# Patient Record
Sex: Female | Born: 1975 | Hispanic: Yes | Marital: Single | Smoking: Never smoker
Health system: Southern US, Community
[De-identification: ages and names within clinical notes are randomized; demographics above are authoritative.]

## PROBLEM LIST (undated history)

## (undated) DIAGNOSIS — F329 Major depressive disorder, single episode, unspecified: Secondary | ICD-10-CM

## (undated) DIAGNOSIS — F32A Depression, unspecified: Secondary | ICD-10-CM

## (undated) HISTORY — DX: Depression, unspecified: F32.A

## (undated) HISTORY — DX: Major depressive disorder, single episode, unspecified: F32.9

---

## 2015-01-25 ENCOUNTER — Ambulatory Visit (INDEPENDENT_AMBULATORY_CARE_PROVIDER_SITE_OTHER): Payer: Self-pay

## 2015-01-25 ENCOUNTER — Ambulatory Visit (INDEPENDENT_AMBULATORY_CARE_PROVIDER_SITE_OTHER): Payer: Self-pay | Admitting: Urgent Care

## 2015-01-25 VITALS — BP 108/82 | HR 84 | Temp 97.8°F | Resp 16 | Ht <= 58 in | Wt 124.8 lb

## 2015-01-25 DIAGNOSIS — M25519 Pain in unspecified shoulder: Secondary | ICD-10-CM | POA: Insufficient documentation

## 2015-01-25 DIAGNOSIS — S46912A Strain of unspecified muscle, fascia and tendon at shoulder and upper arm level, left arm, initial encounter: Secondary | ICD-10-CM

## 2015-01-25 DIAGNOSIS — R200 Anesthesia of skin: Secondary | ICD-10-CM

## 2015-01-25 DIAGNOSIS — R202 Paresthesia of skin: Secondary | ICD-10-CM

## 2015-01-25 DIAGNOSIS — M542 Cervicalgia: Secondary | ICD-10-CM

## 2015-01-25 DIAGNOSIS — M25512 Pain in left shoulder: Secondary | ICD-10-CM

## 2015-01-25 DIAGNOSIS — M62830 Muscle spasm of back: Secondary | ICD-10-CM | POA: Insufficient documentation

## 2015-01-25 MED ORDER — MELOXICAM 7.5 MG PO TABS: 7.5000 mg | ORAL_TABLET | Freq: Every day | ORAL | Status: AC

## 2015-01-25 MED ORDER — CYCLOBENZAPRINE HCL 5 MG PO TABS: 5.0000 mg | ORAL_TABLET | Freq: Three times a day (TID) | ORAL | Status: AC | PRN

## 2015-01-25 NOTE — Patient Instructions (Addendum)
Dolor en el hombro ( Shoulder Pain) El hombro es la articulacin que une los brazos al cuerpo. Los [employer] que forman la articulacin del hombro son el hueso del brazo (hmero), el omplato (escpula) y Curator. La parte superior del hmero es similar a una bola y Chartered certified accountant en una cavidad ms bien plana en la escpula (cavidad glenoidea). Una combinacin de msculos y tejidos fuertes y fibrosos que [employer] msculos a los huesos (tendones) soportan la articulacin del hombro y Engineer, materials la bola en la cavidad. En diferentes zonas de la articulacin hay pequeas bolsas llenas de lquido (bursa). Actan como amortiguadores [employer] y los tejidos blandos que recubren y [place] a reducir la friccin [employer] tendones y el hueso al mover el brazo. La articulacin del hombro permite una amplia gama de movimientos del brazo. Este rango de movimientos permite hacer diferentes cosas,como rascarse la espalda o lanzar una pelota. Sin embargo, esta amplitud de movimientos del hombro tambin lo hace ms propenso al dolor por uso excesivo y por lesiones. Las causas de dolor en el hombro pueden originarse tanto en las lesiones como en el uso excesivo y se pueden agrupar en las siguientes cuatro categoras:  Enrojecimiento, hinchazn y dolor (inflamacin) del tendn (tendinitis) o la bursa (bursitis).  Inestabilidad, como en la luxacin de la articulacin.  Inflamacin de la articulacin (artritis).  Un hueso roto Customer service manager). INSTRUCCIONES PARA EL CUIDADO EN EL HOGAR   Aplique hielo sobre la zona dolorida.  Ponga el hielo en una bolsa plstica.  Colquese una toalla entre la piel y la bolsa de hielo.  Deje el hielo durante 15 a , 3 a 4veces por [employer] [address], o segn las indicaciones del mdico.  Deje de usar compresas fras si no Tourist information centre manager.  Si tiene un cabestrillo o inmovilizador de hombro, llvelo del modo en que su mdico le indique. Solo debe quitrselo  para ducharse o baarse. Mueva el brazo lo menos posible, pero mantenga la mano en movimiento para evitar la hinchazn.  Apriete una pelota blanda o una almohadilla de goma todo lo posible para evitar la hinchazn.  Utilice los medicamentos de venta libre o recetados para Primary school teacher, Environmental health practitioner o la fiebre, segn se lo indique el mdico. SOLICITE ATENCIN MDICA SI:   El dolor en el hombro aumenta o siente un dolor nuevo en el brazo, la mano o los dedos.  La mano o los dedos estn fros y adormecidos.  El dolor no se alivia con los [employer]. SOLICITE ATENCIN MDICA DE INMEDIATO SI:   El brazo, la mano o los dedos estn adormecidos o siente hormigueos.  El brazo, la mano o los dedos estn muy hinchados o se ven blancos o azules. ASEGRESE DE QUE:   Comprende estas instrucciones.  Controlar su afeccin.  Recibir ayuda de inmediato si no mejora o si empeora. Document Released: 03/31/2005 Document Revised: 11/05/2013 [facility] Patient Information 2015 [place], [place]. This information is not intended to replace advice given to you by your health care provider. Make sure you discuss any questions you have with your health care provider.   Calambres y espasmos musculares  (Muscle Cramps and Spasms)  Los calambres musculares y espasmos ocurren cuando un msculo o grupos de msculos se tensan y no se tiene control sobre esta tensin (contraccin muscular involuntaria). Es un problema comn y Software engineer en cualquier msculo. La zona ms comn son los msculos de la pantorrilla. Tanto los [employer]  los espasmos son contracciones musculares involuntarias, pero tambin tienen diferencias:   Los calambres musculares son espordicos y dolorosos. Pueden durar entre algunos segundos hasta un cuarto de [place]. Los calambres musculares son ms fuertes y duran ms que los espasmos musculares.  Los espasmos pueden o no ser dolorosos. Pueden durar algunos segundos o mucho ms. CAUSAS   No es frecuente que los calambres se deban a un trastorno subyacente grave. En muchos casos, la causa de los calambres y los espasmos es desconocida. Algunas causas frecuentes son:   Esfuerzo excesivo.   El uso excesivo del msculo por movimientos repetitivos (hacer lo mismo una y Laverda Page).   Permanecer en cierta posicin durante un largo perodo de [place].   Preparacin, forma o tcnica inadecuada al realizar un deporte o [place].   Deshidratacin.   Traumatismos.   Efectos secundarios de algunos medicamentos.  Niveles anormalmente bajos de las sales e iones en la sangre (electrolitos), especialmente el potasio y el calcio. Pueden ocurrir cuando se toman pldoras para Geographical information systems officer (diurticos) o en las mujeres embarazadas.  Algunos problemas mdicos subyacentes pueden hacer que sea ms propenso a desarrollar calambres o espasmos. Estos incluyen, pero no se limitan a:   Diabetes.   Enfermedad de Parkinson.   Trastornos hormonales, tales como problemas de la tiroides.   El consumo excesivo de alcohol.   Enfermedades especficas de [employer], las articulaciones y [place].   Enfermedad vascular en la que no llega suficiente sangre a los msculos.  INSTRUCCIONES PARA EL CUIDADO EN EL HOGAR   Mantngase bien hidratado. Beba gran cantidad de lquido para mantener la orina de tono claro o color amarillo plido.  Puede ser til Engineer, maintenance (IT), Therapist, music y International aid/development worker el msculo afectado.  Para los msculos tensos o apretados, use una toalla caliente, una almohadilla trmica o agua caliente de la ducha dirigida a la zona afectada.  Si est dolorido o siente dolor despus de un calambre o espasmo, aplique hielo en el rea afectada para Acupuncturist.  Ponga el hielo en una bolsa plstica.  Colquese una toalla entre la piel y la bolsa de hielo.  Deje el hielo en el lugar durante 15 a 20 minutos, 3 a 4 veces por da.  Los medicamentos que se utilizan para tratar las  causas conocidas de los calambres o espasmos pueden reducir su frecuencia o gravedad. Tome slo medicamentos de venta libre o recetados, segn las indicaciones del mdico. SOLICITE ATENCIN MDICA SI:  Los calambres o espasmos empeoran, ocurren con ms frecuencia o no mejoran con Museum/gallery conservator.  ASEGRESE DE QUE:   Comprende estas instrucciones.  Controlar su enfermedad.  Solicitar ayuda de inmediato si no mejora o si empeora. Document Released: 03/31/2005 Document Revised: 10/16/2012 [facility] Patient Information 2015 [place], [place]. This information is not intended to replace advice given to you by your health care provider. Make sure you discuss any questions you have with your health care provider.

## 2015-01-25 NOTE — Progress Notes (Signed)
    MRN: [id number] DOB: [birth year]  Subjective:   Erica Pittman is a 39 y.o. female presenting for chief complaint of Shoulder Pain  Reports 1 week history of left shoulder pain. Patient works in English as a second language teacher, is a Producer, television/film/video. She states that she remembers lifting something heavy about a week ago and since then has had both back pain and left shoulder pain. Her left shoulder pain is most bothersome, is now constant, achy in nature with sharp intermittent pain, worse with movement. Her neck pain is achy and sometimes radiates into her left arm, reports that she feels some tingling. Has tried Tylenol and Advil, was helping initially but no longer having any relief with these medications, has had difficulty sleeping due to her left shoulder pain for last couple of nights. Denies fever, swelling, neck stiffness, weakness, hearing popping or tearing noise when she initially injured her shoulder. Denies any other aggravating or relieving factors, no other questions or concerns.  Erica Pittman currently has no medications in their medication list. She has No Known Allergies.  Erica Pittman  has a past medical history of Depression. Also  has no past surgical history on file.  ROS As in subjective.  Objective:   Vitals: BP 108/82 mmHg  Pulse 84  Temp(Src) 97.8 F (36.6 C) (Oral)  Resp 16  Ht  (1.422 m)  Wt 124 lb 12.8 oz (56.609 kg)  BMI 28.00 kg/m2  SpO2 99%  LMP 12/28/2014  Physical Exam  Constitutional: She is oriented to person, place, and time. She appears well-developed and well-nourished.  Cardiovascular: Normal rate.   Pulmonary/Chest: Effort normal.  Musculoskeletal:       Left shoulder: She exhibits tenderness (over deltoid and left trapezius) and spasm (over trapezius). She exhibits normal range of motion, no bony tenderness, no swelling, no effusion, no crepitus, no deformity, no laceration and normal strength.       Cervical back: She exhibits tenderness (over  paraspinal muscles) and spasm. She exhibits normal range of motion, no bony tenderness, no swelling, no edema, no deformity and no laceration.  Negative Spurling, Hawkins and Neer test. Strength 5/5.  Neurological: She is alert and oriented to person, place, and time. She has normal reflexes.  Skin: Skin is warm and dry. No rash noted. No erythema. No pallor.   [facility] reading (PRIMARY) by  Dr. Milus Glazier and PA-Ragna Kramlich. Cervical - suggestion of foraminal encroachment at level of C7-T1 on left, please comment. Left shoulder - normal.  Assessment and Plan :   1. Neck pain 2. Pain in joint, shoulder region, left 3. Numbness and tingling in left arm 4. Shoulder strain, left, initial encounter 5. Muscle spasm of back - Will opt for conservative management with meloxicam and flexeril, if patient does not have any improvement in 2 weeks, will refer to ortho for further evaluation. Consider MRI for soft tissue evaluation, joint injection.  Wallis Bamberg, PA-C [facility] [phone] 01/25/2015 10:13 AM

## 2017-02-23 IMAGING — CR DG SHOULDER 2+V*L*
3 series · 3 of 3 positions shown · non-contrast
Comparison: None.

CLINICAL DATA: Neck and left shoulder pain. No known injury.
Initial encounter.

EXAM:
LEFT SHOULDER - 2+ VIEW

[ap ext rot]
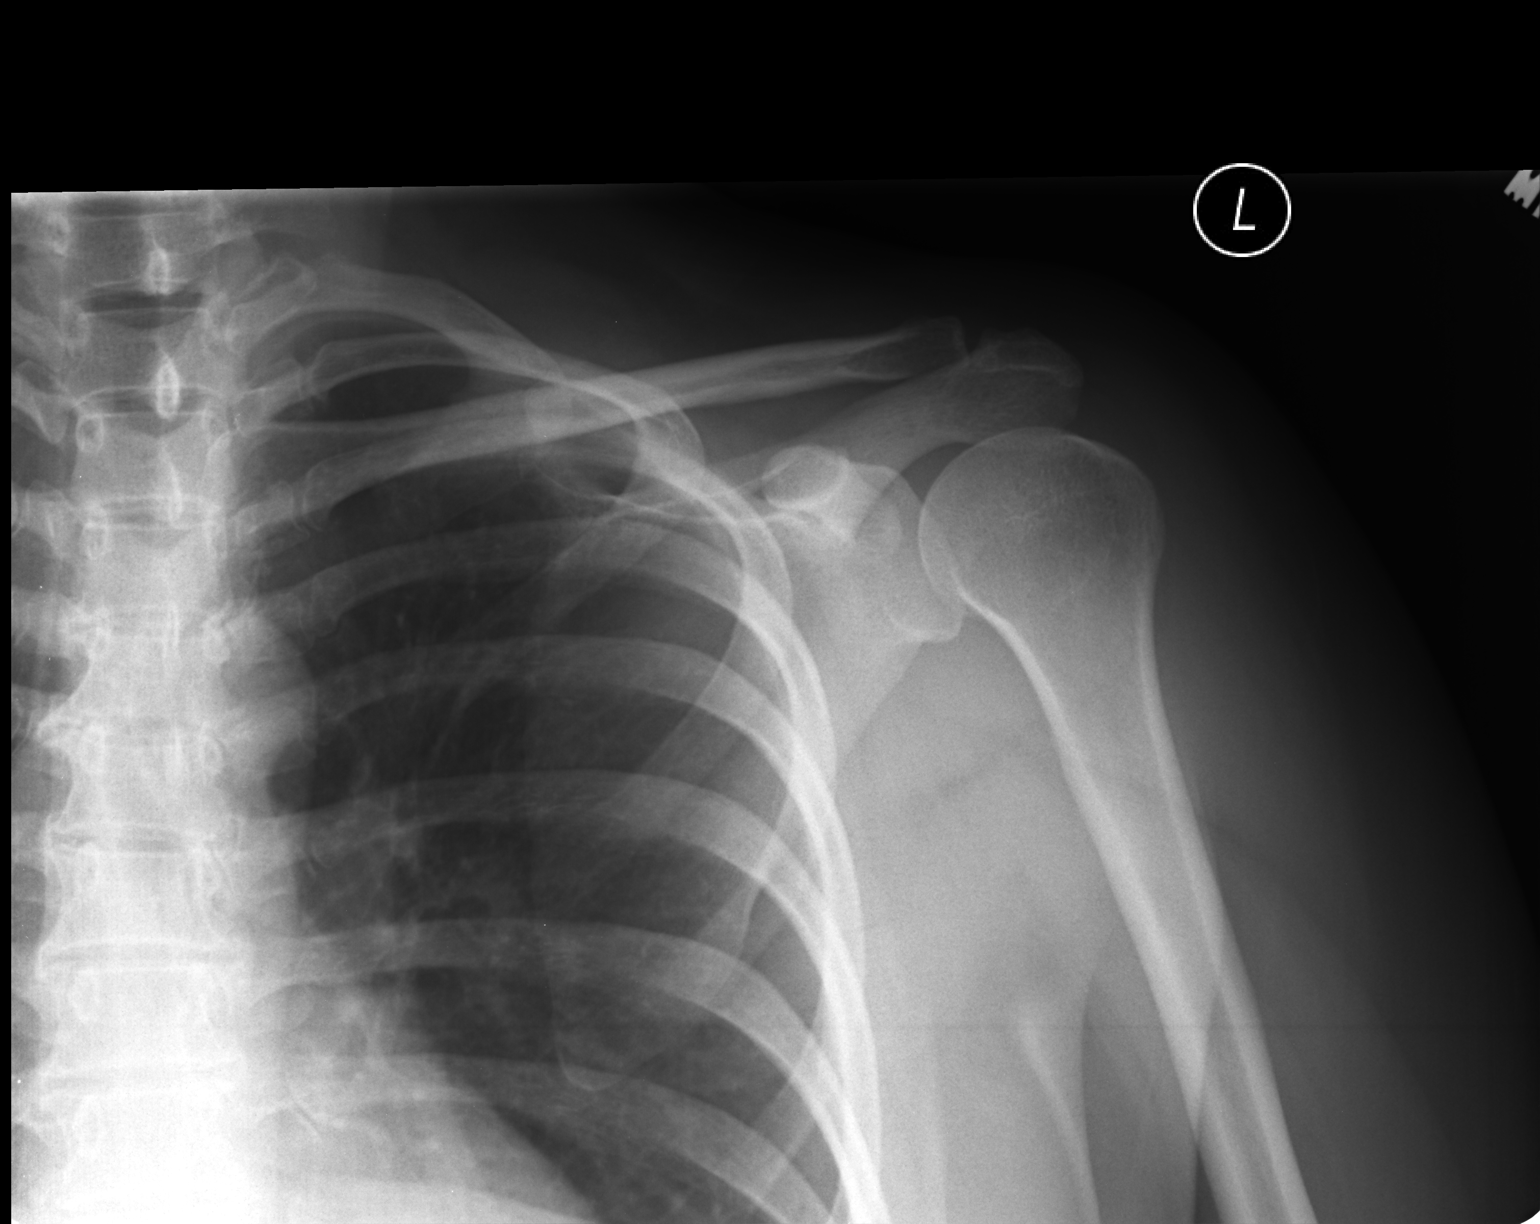

[pa y view]
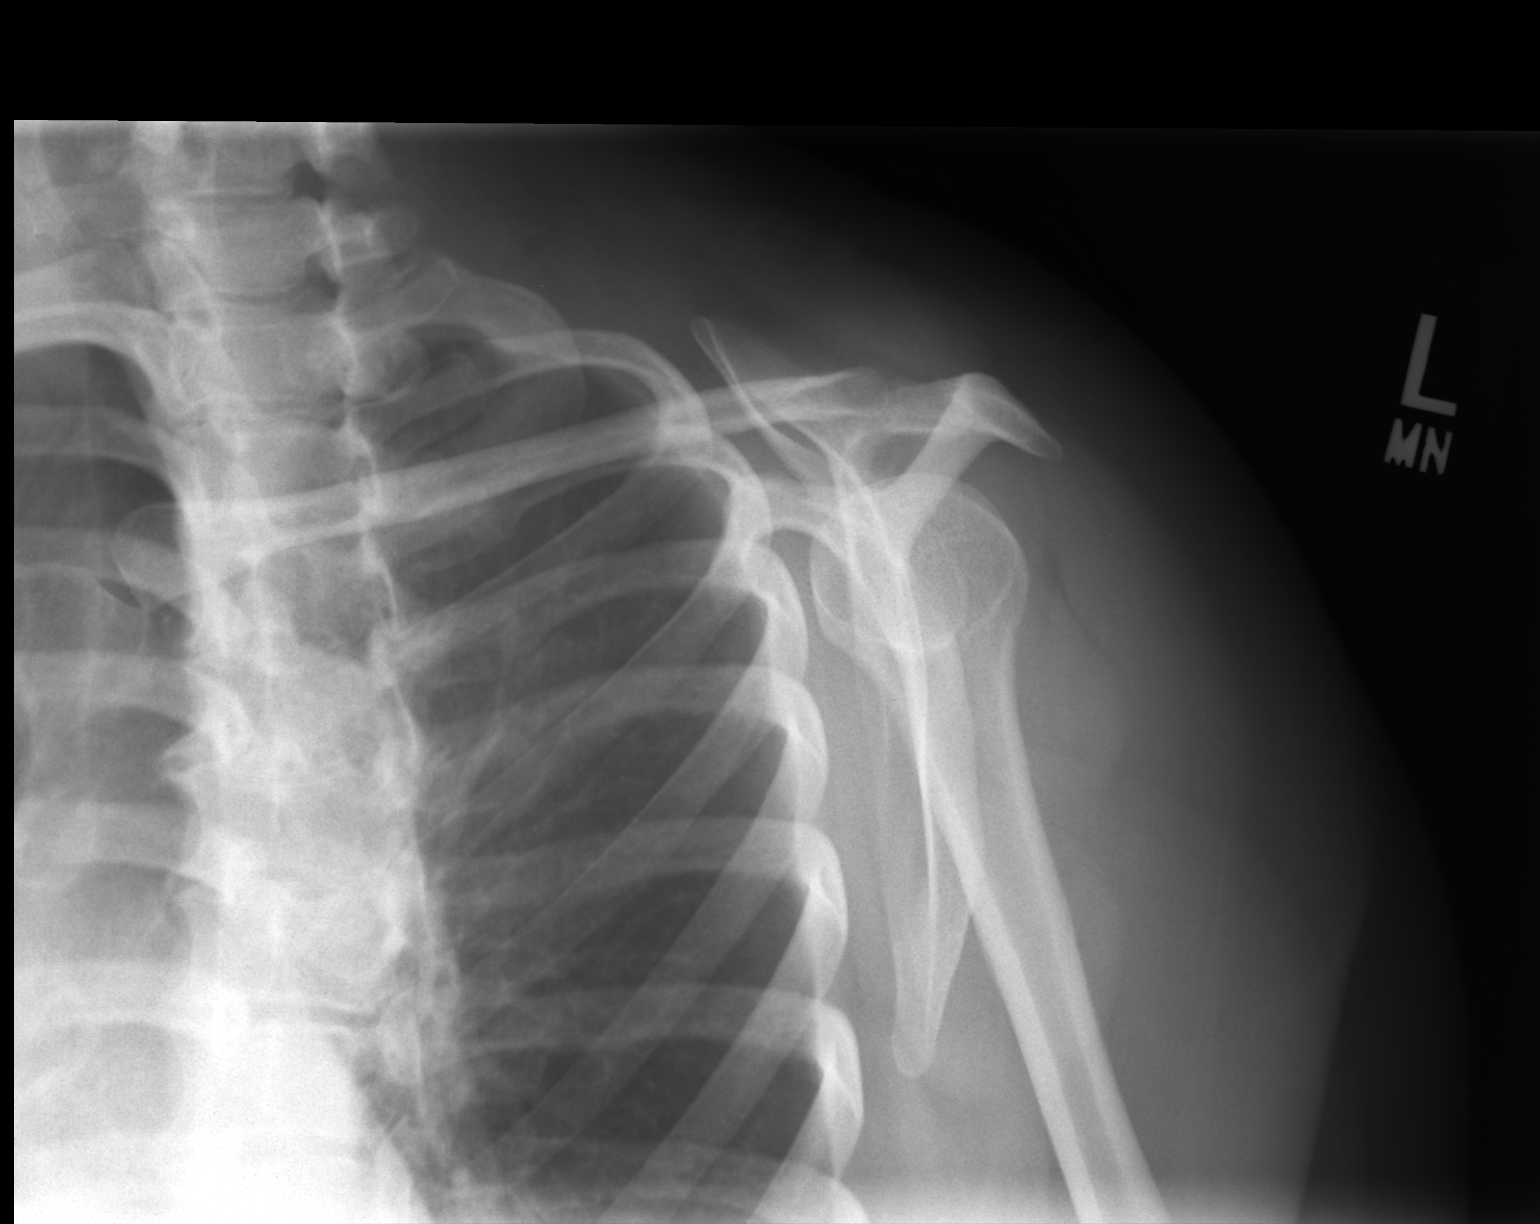

[axial]
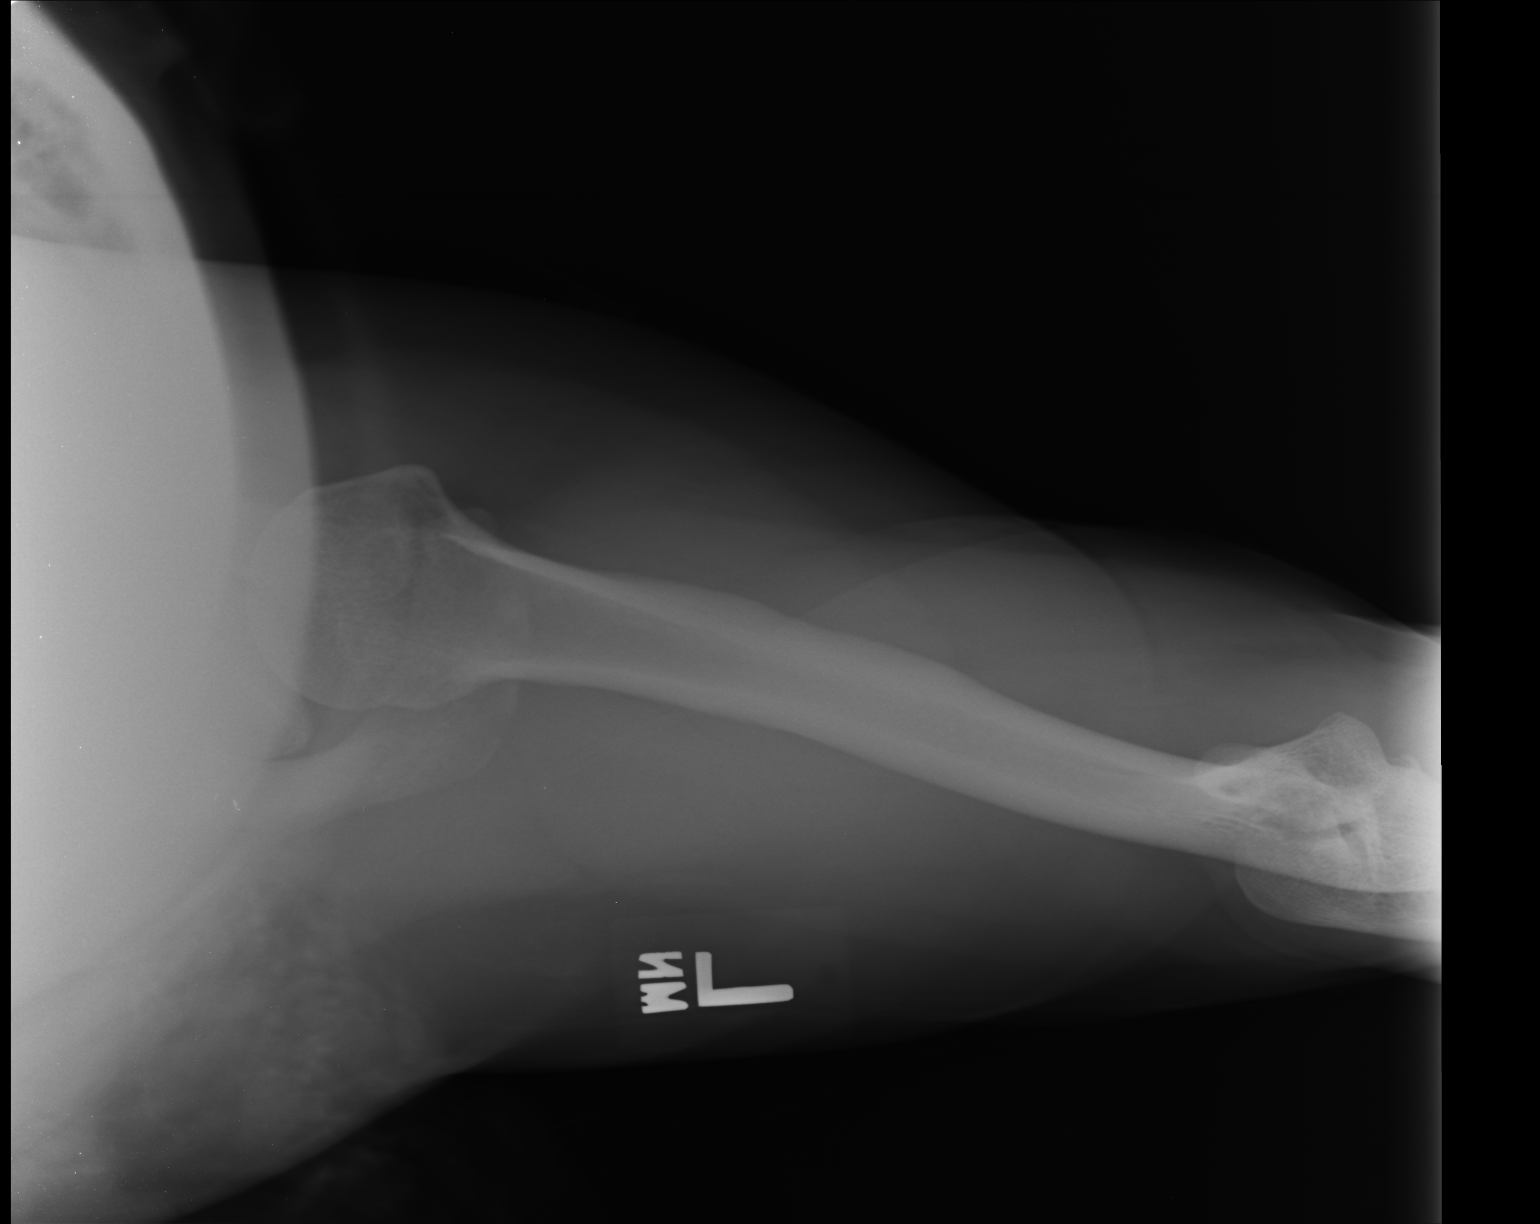

[3 of 3 positions shown; findings below may reference images not displayed]

FINDINGS: There is no evidence of fracture or dislocation. There is no
evidence of arthropathy or other focal bone abnormality. Soft
tissues are unremarkable.
IMPRESSION: Normal exam.
